# Patient Record
Sex: Female | Born: 2000
Health system: Southern US, Community
[De-identification: ages and names within clinical notes are randomized; demographics above are authoritative.]

## PROBLEM LIST (undated history)

## (undated) DIAGNOSIS — R040 Epistaxis: Secondary | ICD-10-CM

## (undated) DIAGNOSIS — F845 Asperger's syndrome: Secondary | ICD-10-CM

## (undated) DIAGNOSIS — R209 Unspecified disturbances of skin sensation: Secondary | ICD-10-CM

## (undated) DIAGNOSIS — F919 Conduct disorder, unspecified: Secondary | ICD-10-CM

## (undated) DIAGNOSIS — Q600 Renal agenesis, unilateral: Secondary | ICD-10-CM

## (undated) HISTORY — DX: Unspecified disturbances of skin sensation: R20.9

## (undated) HISTORY — PX: OTHER SURGICAL HISTORY: SHX169

## (undated) HISTORY — DX: Asperger's syndrome: F84.5

## (undated) HISTORY — DX: Epistaxis: R04.0

## (undated) HISTORY — DX: Renal agenesis, unilateral: Q60.0

## (undated) HISTORY — DX: Conduct disorder, unspecified: F91.9

---
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
## 2022-08-17 ENCOUNTER — Telehealth: Payer: Self-pay

## 2022-08-17 NOTE — Transitions of Care (Post Inpatient/ED Visit) (Unsigned)
   08/17/2022  Name: Jessica Olson MRN: [id number] DOB: 2000/07/20  Today's TOC FU Call Status: Today's TOC FU Call Status:: Successful TOC FU Call Competed TOC FU Call Complete Date: 08/17/22  Attempted to reach the patient regarding the most recent Inpatient/ED visit.  Follow Up Plan: Additional outreach attempts will be made to reach the patient to complete the Transitions of Care (Post Inpatient/ED visit) call.   Signature Karena Addison, LPN [facility] Nurse Health Advisor Direct Dial [phone]

## 2022-08-18 NOTE — Transitions of Care (Post Inpatient/ED Visit) (Signed)
   08/18/2022  Name: Akisha Schmidtke MRN: [id number] DOB: 2000/06/22  Today's TOC FU Call Status: Today's TOC FU Call Status:: Unsuccessful Call (3rd Attempt) TOC FU Call Complete Date: 08/18/22  Attempted to reach the patient regarding the most recent Inpatient/ED visit.  Follow Up Plan: No further outreach attempts will be made at this time. We have been unable to contact the patient.  Signature Karena Addison, LPN [facility] Nurse Health Advisor Direct Dial [phone]
